# Patient Record
Sex: Male | Born: 1975 | Race: White | Hispanic: No | Marital: Single | Smoking: Never smoker
Health system: Southern US, Community
[De-identification: ages and names within clinical notes are randomized; demographics above are authoritative.]

## PROBLEM LIST (undated history)

## (undated) DIAGNOSIS — T4145XA Adverse effect of unspecified anesthetic, initial encounter: Secondary | ICD-10-CM

## (undated) DIAGNOSIS — T8859XA Other complications of anesthesia, initial encounter: Secondary | ICD-10-CM

## (undated) DIAGNOSIS — S46219A Strain of muscle, fascia and tendon of other parts of biceps, unspecified arm, initial encounter: Secondary | ICD-10-CM

## (undated) DIAGNOSIS — H7291 Unspecified perforation of tympanic membrane, right ear: Secondary | ICD-10-CM

## (undated) HISTORY — PX: INNER EAR SURGERY: SHX679

## (undated) HISTORY — PX: WISDOM TOOTH EXTRACTION: SHX21

---

## 2012-03-28 ENCOUNTER — Encounter (HOSPITAL_BASED_OUTPATIENT_CLINIC_OR_DEPARTMENT_OTHER): Payer: Self-pay | Admitting: *Deleted

## 2012-03-28 ENCOUNTER — Emergency Department (HOSPITAL_BASED_OUTPATIENT_CLINIC_OR_DEPARTMENT_OTHER)
Admission: EM | Admit: 2012-03-28 | Discharge: 2012-03-29 | Disposition: A | Discharge status: Home / Self Care | Payer: Self-pay | Attending: Emergency Medicine | Admitting: Emergency Medicine

## 2012-03-28 DIAGNOSIS — H6690 Otitis media, unspecified, unspecified ear: Secondary | ICD-10-CM

## 2012-03-28 DIAGNOSIS — H7290 Unspecified perforation of tympanic membrane, unspecified ear: Secondary | ICD-10-CM | POA: Insufficient documentation

## 2012-03-28 HISTORY — DX: Unspecified perforation of tympanic membrane, right ear: H72.91

## 2012-03-28 NOTE — ED Notes (Signed)
Pt accidentally got pond water in his right ear at 630 this evening. Pt has hx of perforated eardrum and is supposed to protect his ears prior to getting in water. Pt reports right sided earache.

## 2012-03-29 MED ORDER — NEOMYCIN-COLIST-HC-THONZONIUM 3.3-3-10-0.5 MG/ML OT SUSP
4.0000 [drp] | Freq: Four times a day (QID) | OTIC | Status: DC
  Administered 2012-03-29: 4 [drp] via OTIC
  Filled 2012-03-29: qty 5

## 2012-03-29 MED ORDER — AMOXICILLIN 875 MG PO TABS: 875.0000 mg | ORAL_TABLET | Freq: Three times a day (TID) | ORAL | Status: AC

## 2012-03-29 NOTE — Discharge Instructions (Signed)
Draining Ear Ear wax, pus, blood and other fluids are examples of the different types of drainage from ears. Drops or cream may be needed to lessen the itching which may occur with ear drainage. CAUSES   Skin irritations in the ear.   Ear infection.   Swimmer's ear.   Ruptured eardrum.   Foreign object in the ear canal.   Sudden pressure changes.   Head injury.  HOME CARE INSTRUCTIONS   Only take over-the-counter or prescription medicines for pain, fever, or discomfort as directed by your caregiver.   Do not rub the ear canal with cotton-tipped swabs.   Do not swim.   Before you take a shower, cover a cotton ball with petroleum jelly to keep water out.   Limit exposure to smoke. Secondhand smoke can increase the chance for ear infections.   Keep up with immunizations.   Wash your hands well.   Keep all follow-up appointments to examine the ear and evaluate hearing.  SEEK MEDICAL CARE IF:   You have increased drainage.   You have ear pain, a fever, or drainage that is not getting better after 48 hours of antibiotics.   You are unusually tired.  SEEK IMMEDIATE MEDICAL CARE IF:  You have severe ear pain or headache.   You vomit.   You feel dizzy.   You have a seizure.   You have new hearing loss.  MAKE SURE YOU:   Understand these instructions.   Will watch your condition.   Will get help right away if you are not doing well or get worse.  Document Released: 10/25/2005 Document Revised: 10/14/2011 Document Reviewed: 08/28/2009 [facility] Patient Information 2012 [place], [place].

## 2012-03-29 NOTE — ED Provider Notes (Signed)
History     CSN: [id number]  Arrival date & time 03/28/12  2203   First MD Initiated Contact with Patient 03/29/12 [phone]      Chief Complaint  Patient presents with  . 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 Has never had this surgically repaired. He was working yesterday evening about 6:30 and got some 30 pound water splashed in his ear. Within about 2 and half hours she developed severe pain in the right ear. He took some garlic extract with significant improvement in his pain. 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 He states his been draining clear fluid.  Past Medical History  Diagnosis Date  . Perforated eardrum, right     History reviewed. No pertinent past surgical history.  No family history on file.  History  Substance Use Topics  . Smoking status: Never Smoker   . Smokeless tobacco: Not on file  . Alcohol Use: No      Review of Systems  All other systems reviewed and are negative.    Allergies  Review of patient's allergies indicates no known allergies.  Home Medications   Current Outpatient Rx  Name Route Sig Dispense Refill  . GARLIC PO Oral Take 3 tablets by mouth daily.    . IBUPROFEN 200 MG PO TABS Oral Take 200 mg by mouth every 6 (six) hours as needed. Patient used this medication for pain.    Marland Kitchen PSEUDOEPHEDRINE-IBUPROFEN 30-200 MG PO TABS Oral Take 1 tablet by mouth daily as needed. Patient used this medication for sinus congestion.      BP 152/92  Pulse 71  Temp(Src) 98.4 F (36.9 C) (Oral)  Resp 18  Ht 5\' 10"  (1.778 m)  Wt 180 lb (81.647 kg)  BMI 25.83 kg/m2  SpO2 98%  Physical Exam General: Well-developed, well-nourished male in no acute distress; appearance consistent with age of record HENT: normocephalic, atraumatic; left TM normal; right TM with chronic  appearing perforation and erythema of the TM itself, no exudate seen Eyes: pupils equal round and reactive to light; extraocular muscles intact Neck: supple Heart: regular rate and rhythm Lungs: Normal respiratory effort and excursion Abdomen: soft; nondistended Extremities: No deformity; full range of motion Neurologic: Awake, alert and oriented; motor function intact in all extremities and symmetric; no facial droop; decreased hearing in right ear Skin: Warm and dry     ED Course  Procedures (including critical care time)     MDM  We'll treat with Cortisporin optic suspension which is safe for use in perforations. Will also treat with amoxicillin.        Hanley Seamen, MD 03/29/12 [phone]

## 2018-01-27 ENCOUNTER — Other Ambulatory Visit: Payer: Self-pay

## 2018-01-27 ENCOUNTER — Encounter (HOSPITAL_COMMUNITY): Payer: Self-pay | Admitting: *Deleted

## 2018-01-27 NOTE — Progress Notes (Signed)
Pt denies SOB, chest pain, and being under the care of a cardiologist. Pt denies having a stress test, echo and cardiac cath. Pt denies having an EKG and chest x ray within the last year. Pt made aware to stop taking vitamins, fish oil, Garlic, Pseudoephedrine-Ibuprofen and herbal medications. Do not take any NSAIDs ie: Ibuprofen, Advil, Naproxen (Aleve), Motrin, BC and Goody Powder. Pt verbalized understanding of all pre-op instructions.

## 2018-01-27 NOTE — H&P (Addendum)
  Jerry Soto is an 42 y.o. male.   Chief Complaint: RIGHT DISTAL BICEPS TENDON TEAR  HPI: The patient is a 42 y/o male who felt a pop in his right antecubital fossa on 01/17/18 when he was doing a jerking motion. He was seen by Dr Ranell PatrickNorris who ordered the MRI that showed a distal biceps tendon tear. Dr Melvyn Novasrtmann went to see the patient to discuss the surgical procedure, including the risks versus benefits, and the post-operative recovery.  The patient is here today for surgery.  He denies fever, chills, nausea, vomiting, diarrhea, chest pain, or shortness of breath.  Past Medical History:  Diagnosis Date  . Perforated eardrum, right     No past surgical history on file.  No family history on file. Social History:  reports that he has never smoked. He does not have any smokeless tobacco history on file. He reports that he does not drink alcohol or use drugs.  Allergies: No Known Allergies  No medications prior to admission.    No results found for this or any previous visit (from the past 48 hour(s)). No results found.  ROS NO RECENT ILLNESSES OR HOSPITALIZATIONS  There were no vitals taken for this visit. Physical Exam  General Appearance:  Alert, cooperative, no distress, appears stated age  Head:  Normocephalic, without obvious abnormality, atraumatic  Eyes:  Pupils equal, conjunctiva/corneas clear,         Throat: Lips, mucosa, and tongue normal; teeth and gums normal  Neck: No visible masses     Lungs:   respirations unlabored  Chest Wall:  No tenderness or deformity  Heart:  Regular rate and rhythm,  Abdomen:   Soft, non-tender,         Extremities: RUE: SKIN INTACT, FINGERS WARM WELL PERFUSED GOOD DIGITAL MOTION ABLE TO EXTEND THUMB AND WRIST  Pulses: 2+ and symmetric  Skin: Skin color, texture, turgor normal, no rashes or lesions     Neurologic: Normal    Assessment RIGHT DISTAL BICEPS TENDON TEAR  Plan RIGHT DISTAL BICEPS TENDON REPAIR  R/B/A  DISCUSSED WITH PT IN OFFICE.  PT VOICED UNDERSTANDING OF PLAN CONSENT SIGNED DAY OF SURGERY PT SEEN AND EXAMINED PRIOR TO OPERATIVE PROCEDURE/DAY OF SURGERY SITE MARKED. QUESTIONS ANSWERED WILL GO HOME FOLLOWING SURGERY  WE ARE PLANNING SURGERY FOR YOUR UPPER EXTREMITY. THE RISKS AND BENEFITS OF SURGERY INCLUDE BUT NOT LIMITED TO BLEEDING INFECTION, DAMAGE TO NEARBY NERVES ARTERIES TENDONS, FAILURE OF SURGERY TO ACCOMPLISH ITS INTENDED GOALS, PERSISTENT SYMPTOMS AND NEED FOR FURTHER SURGICAL INTERVENTION. WITH THIS IN MIND WE WILL PROCEED. I HAVE DISCUSSED WITH THE PATIENT THE PRE AND POSTOPERATIVE REGIMEN AND THE DOS AND DON'TS. PT VOICED UNDERSTANDING AND INFORMED CONSENT SIGNED.    Karma GreaserSamantha Bonham Barton 01/27/2018, 12:36 PM   WE ARE PLANNING SURGERY FOR YOUR UPPER EXTREMITY. THE RISKS AND BENEFITS OF SURGERY INCLUDE BUT NOT LIMITED TO BLEEDING INFECTION, DAMAGE TO NEARBY NERVES ARTERIES TENDONS, FAILURE OF SURGERY TO ACCOMPLISH ITS INTENDED GOALS, PERSISTENT SYMPTOMS AND NEED FOR FURTHER SURGICAL INTERVENTION. WITH THIS IN MIND WE WILL PROCEED. I HAVE DISCUSSED WITH THE PATIENT THE PRE AND POSTOPERATIVE REGIMEN AND THE DOS AND DON'TS. PT VOICED UNDERSTANDING AND INFORMED CONSENT SIGNED  R/B/A DISCUSSED WITH PT IN OFFICE.  PT VOICED UNDERSTANDING OF PLAN CONSENT SIGNED DAY OF SURGERY PT SEEN AND EXAMINED PRIOR TO OPERATIVE PROCEDURE/DAY OF SURGERY SITE MARKED. QUESTIONS ANSWERED WILL GO HOME FOLLOWING SURGERY

## 2018-01-30 ENCOUNTER — Ambulatory Visit (HOSPITAL_COMMUNITY): Payer: Self-pay | Admitting: Anesthesiology

## 2018-01-30 ENCOUNTER — Other Ambulatory Visit: Payer: Self-pay

## 2018-01-30 ENCOUNTER — Encounter (HOSPITAL_COMMUNITY)
Admission: RE | Disposition: A | Discharge status: Home / Self Care | Payer: Self-pay | Source: Ambulatory Visit | Attending: Orthopedic Surgery

## 2018-01-30 ENCOUNTER — Ambulatory Visit (HOSPITAL_COMMUNITY)
Admission: RE | Admit: 2018-01-30 | Discharge: 2018-01-30 | Disposition: A | Discharge status: Home / Self Care | Payer: Self-pay | Source: Ambulatory Visit | Attending: Orthopedic Surgery | Admitting: Orthopedic Surgery

## 2018-01-30 ENCOUNTER — Encounter (HOSPITAL_COMMUNITY): Payer: Self-pay | Admitting: *Deleted

## 2018-01-30 DIAGNOSIS — S46211A Strain of muscle, fascia and tendon of other parts of biceps, right arm, initial encounter: Secondary | ICD-10-CM | POA: Insufficient documentation

## 2018-01-30 DIAGNOSIS — X58XXXA Exposure to other specified factors, initial encounter: Secondary | ICD-10-CM | POA: Insufficient documentation

## 2018-01-30 HISTORY — DX: Other complications of anesthesia, initial encounter: T88.59XA

## 2018-01-30 HISTORY — PX: DISTAL BICEPS TENDON REPAIR: SHX1461

## 2018-01-30 HISTORY — DX: Adverse effect of unspecified anesthetic, initial encounter: T41.45XA

## 2018-01-30 HISTORY — DX: Strain of muscle, fascia and tendon of other parts of biceps, unspecified arm, initial encounter: S46.219A

## 2018-01-30 LAB — CBC
HCT: 42.9 % (ref 39.0–52.0)
Hemoglobin: 14.3 g/dL (ref 13.0–17.0)
MCH: 28.4 pg (ref 26.0–34.0)
MCHC: 33.3 g/dL (ref 30.0–36.0)
MCV: 85.3 fL (ref 78.0–100.0)
PLATELETS: 281 10*3/uL (ref 150–400)
RBC: 5.03 MIL/uL (ref 4.22–5.81)
RDW: 13.8 % (ref 11.5–15.5)
WBC: 6 10*3/uL (ref 4.0–10.5)

## 2018-01-30 SURGERY — REPAIR, TENDON, BICEPS, DISTAL
Anesthesia: General | Site: Arm Upper | Laterality: Right

## 2018-01-30 MED ORDER — ONDANSETRON HCL 4 MG/2ML IJ SOLN
INTRAMUSCULAR | Status: AC
  Filled 2018-01-30: qty 2

## 2018-01-30 MED ORDER — KETOROLAC TROMETHAMINE 60 MG/2ML IM SOLN: 60.0000 mg | Freq: Once | INTRAMUSCULAR | Status: DC

## 2018-01-30 MED ORDER — FENTANYL CITRATE (PF) 250 MCG/5ML IJ SOLN
INTRAMUSCULAR | Status: AC
Start: 2018-01-30 — End: 2018-01-30
  Filled 2018-01-30: qty 5

## 2018-01-30 MED ORDER — KETOROLAC TROMETHAMINE 60 MG/2ML IM SOLN
INTRAMUSCULAR | Status: AC
  Filled 2018-01-30: qty 2

## 2018-01-30 MED ORDER — ONDANSETRON HCL 4 MG/2ML IJ SOLN
INTRAMUSCULAR | Status: DC | PRN
  Administered 2018-01-30: 4 mg via INTRAVENOUS

## 2018-01-30 MED ORDER — FENTANYL CITRATE (PF) 100 MCG/2ML IJ SOLN
INTRAMUSCULAR | Status: AC
  Administered 2018-01-30: 50 ug via INTRAVENOUS
  Filled 2018-01-30: qty 2

## 2018-01-30 MED ORDER — HYDROMORPHONE HCL 1 MG/ML IJ SOLN
INTRAMUSCULAR | Status: AC
  Filled 2018-01-30: qty 1

## 2018-01-30 MED ORDER — CHLORHEXIDINE GLUCONATE 4 % EX LIQD: 60.0000 mL | Freq: Once | CUTANEOUS | Status: DC

## 2018-01-30 MED ORDER — PROPOFOL 10 MG/ML IV BOLUS
INTRAVENOUS | Status: AC
  Filled 2018-01-30: qty 20

## 2018-01-30 MED ORDER — OXYCODONE HCL 5 MG PO TABS
ORAL_TABLET | ORAL | Status: AC
  Filled 2018-01-30: qty 1

## 2018-01-30 MED ORDER — FENTANYL CITRATE (PF) 100 MCG/2ML IJ SOLN
INTRAMUSCULAR | Status: AC
  Filled 2018-01-30: qty 2

## 2018-01-30 MED ORDER — MIDAZOLAM HCL 2 MG/2ML IJ SOLN
INTRAMUSCULAR | Status: AC
  Filled 2018-01-30: qty 2

## 2018-01-30 MED ORDER — DEXAMETHASONE SODIUM PHOSPHATE 10 MG/ML IJ SOLN
INTRAMUSCULAR | Status: AC
  Filled 2018-01-30: qty 1

## 2018-01-30 MED ORDER — OXYCODONE HCL 5 MG/5ML PO SOLN: 5.0000 mg | Freq: Once | ORAL | Status: AC | PRN

## 2018-01-30 MED ORDER — KETOROLAC TROMETHAMINE 15 MG/ML IJ SOLN
15.0000 mg | Freq: Once | INTRAMUSCULAR | Status: AC
  Administered 2018-01-30: 15 mg via INTRAVENOUS

## 2018-01-30 MED ORDER — HYDROMORPHONE HCL 1 MG/ML IJ SOLN
1.0000 mg | Freq: Once | INTRAMUSCULAR | Status: AC
  Administered 2018-01-30: 1 mg via INTRAVENOUS

## 2018-01-30 MED ORDER — LIDOCAINE 2% (20 MG/ML) 5 ML SYRINGE
INTRAMUSCULAR | Status: DC | PRN
  Administered 2018-01-30: 60 mg via INTRAVENOUS

## 2018-01-30 MED ORDER — MIDAZOLAM HCL 2 MG/2ML IJ SOLN
1.0000 mg | Freq: Once | INTRAMUSCULAR | Status: AC
  Administered 2018-01-30: 1 mg via INTRAVENOUS

## 2018-01-30 MED ORDER — ONDANSETRON HCL 4 MG/2ML IJ SOLN
4.0000 mg | Freq: Once | INTRAMUSCULAR | Status: AC | PRN
  Administered 2018-01-30: 4 mg via INTRAVENOUS

## 2018-01-30 MED ORDER — FENTANYL CITRATE (PF) 100 MCG/2ML IJ SOLN
INTRAMUSCULAR | Status: DC | PRN
  Administered 2018-01-30: 100 ug via INTRAVENOUS

## 2018-01-30 MED ORDER — KETOROLAC TROMETHAMINE 15 MG/ML IJ SOLN
INTRAMUSCULAR | Status: AC
  Filled 2018-01-30: qty 2

## 2018-01-30 MED ORDER — FENTANYL CITRATE (PF) 100 MCG/2ML IJ SOLN
25.0000 ug | INTRAMUSCULAR | Status: DC | PRN
  Administered 2018-01-30 (×3): 50 ug via INTRAVENOUS

## 2018-01-30 MED ORDER — KETOROLAC TROMETHAMINE 30 MG/ML IJ SOLN: 30.0000 mg | Freq: Once | INTRAMUSCULAR | Status: DC

## 2018-01-30 MED ORDER — LACTATED RINGERS IV SOLN
INTRAVENOUS | Status: DC
  Administered 2018-01-30: 14:00:00 via INTRAVENOUS

## 2018-01-30 MED ORDER — LIDOCAINE HCL (CARDIAC) 20 MG/ML IV SOLN
INTRAVENOUS | Status: AC
Start: 2018-01-30 — End: 2018-01-30
  Filled 2018-01-30: qty 5

## 2018-01-30 MED ORDER — MIDAZOLAM HCL 5 MG/5ML IJ SOLN
INTRAMUSCULAR | Status: DC | PRN
  Administered 2018-01-30: 2 mg via INTRAVENOUS

## 2018-01-30 MED ORDER — CEFAZOLIN SODIUM-DEXTROSE 2-4 GM/100ML-% IV SOLN
2.0000 g | INTRAVENOUS | Status: AC
  Administered 2018-01-30: 2 g via INTRAVENOUS
  Filled 2018-01-30: qty 100

## 2018-01-30 MED ORDER — DEXAMETHASONE SODIUM PHOSPHATE 10 MG/ML IJ SOLN
INTRAMUSCULAR | Status: DC | PRN
  Administered 2018-01-30: 10 mg via INTRAVENOUS

## 2018-01-30 MED ORDER — BUPIVACAINE HCL (PF) 0.25 % IJ SOLN
INTRAMUSCULAR | Status: DC | PRN
  Administered 2018-01-30: 10 mL

## 2018-01-30 MED ORDER — BUPIVACAINE HCL (PF) 0.25 % IJ SOLN
INTRAMUSCULAR | Status: AC
Start: 2018-01-30 — End: 2018-01-30
  Filled 2018-01-30: qty 30

## 2018-01-30 MED ORDER — OXYCODONE HCL 5 MG PO TABS
5.0000 mg | ORAL_TABLET | Freq: Once | ORAL | Status: AC | PRN
  Administered 2018-01-30: 5 mg via ORAL

## 2018-01-30 MED ORDER — PROPOFOL 10 MG/ML IV BOLUS
INTRAVENOUS | Status: DC | PRN
  Administered 2018-01-30: 100 mg via INTRAVENOUS
  Administered 2018-01-30: 190 mg via INTRAVENOUS

## 2018-01-30 SURGICAL SUPPLY — 65 items
BANDAGE ACE 3X5.8 VEL STRL LF (GAUZE/BANDAGES/DRESSINGS) ×2 IMPLANT
BANDAGE ACE 4X5 VEL STRL LF (GAUZE/BANDAGES/DRESSINGS) ×2 IMPLANT
BENZOIN TINCTURE PRP APPL 2/3 (GAUZE/BANDAGES/DRESSINGS) ×2 IMPLANT
BLADE CLIPPER SURG (BLADE) IMPLANT
BNDG ESMARK 4X9 LF (GAUZE/BANDAGES/DRESSINGS) ×2 IMPLANT
BNDG GAUZE ELAST 4 BULKY (GAUZE/BANDAGES/DRESSINGS) IMPLANT
BUR EGG ELITE 4.0 (BURR) ×2 IMPLANT
BUTTON DISTAL BICEPS (Orthopedic Implant) ×2 IMPLANT
CLIP VESOCCLUDE MED 6/CT (CLIP) IMPLANT
CLIP VESOCCLUDE SM WIDE 6/CT (CLIP) IMPLANT
CORDS BIPOLAR (ELECTRODE) ×2 IMPLANT
COVER SURGICAL LIGHT HANDLE (MISCELLANEOUS) ×2 IMPLANT
CUFF TOURNIQUET SINGLE 18IN (TOURNIQUET CUFF) ×2 IMPLANT
CUFF TOURNIQUET SINGLE 24IN (TOURNIQUET CUFF) IMPLANT
DRAIN TLS ROUND 10FR (DRAIN) IMPLANT
DRAPE OEC MINIVIEW 54X84 (DRAPES) IMPLANT
DRAPE SURG 17X23 STRL (DRAPES) ×2 IMPLANT
DRAPE U-SHAPE 47X51 STRL (DRAPES) ×2 IMPLANT
DRSG ADAPTIC 3X8 NADH LF (GAUZE/BANDAGES/DRESSINGS) ×2 IMPLANT
ELECT REM PT RETURN 9FT ADLT (ELECTROSURGICAL) ×2
ELECTRODE REM PT RTRN 9FT ADLT (ELECTROSURGICAL) ×1 IMPLANT
GAUZE SPONGE 4X4 12PLY STRL (GAUZE/BANDAGES/DRESSINGS) ×2 IMPLANT
GLOVE BIOGEL PI IND STRL 8.5 (GLOVE) ×1 IMPLANT
GLOVE BIOGEL PI INDICATOR 8.5 (GLOVE) ×1
GLOVE SURG ORTHO 8.0 STRL STRW (GLOVE) ×2 IMPLANT
GOWN STRL REUS W/ TWL LRG LVL3 (GOWN DISPOSABLE) ×1 IMPLANT
GOWN STRL REUS W/ TWL XL LVL3 (GOWN DISPOSABLE) ×1 IMPLANT
GOWN STRL REUS W/TWL LRG LVL3 (GOWN DISPOSABLE) ×1
GOWN STRL REUS W/TWL XL LVL3 (GOWN DISPOSABLE) ×1
INSERTER BUTTON (SYSTAGENIX WOUND MANAGEMENT) ×2 IMPLANT
KIT BASIN OR (CUSTOM PROCEDURE TRAY) ×2 IMPLANT
KIT BIO-TENODESIS 3X8 DISP (MISCELLANEOUS)
KIT INSRT BABSR STRL DISP BTN (MISCELLANEOUS) IMPLANT
KIT ROOM TURNOVER OR (KITS) ×2 IMPLANT
LOOP VESSEL MAXI BLUE (MISCELLANEOUS) IMPLANT
MANIFOLD NEPTUNE II (INSTRUMENTS) ×2 IMPLANT
NDL SUT 6 .5 CRC .975X.05 MAYO (NEEDLE) IMPLANT
NEEDLE 22X1 1/2 (OR ONLY) (NEEDLE) IMPLANT
NEEDLE MAYO TAPER (NEEDLE)
NS IRRIG 1000ML POUR BTL (IV SOLUTION) ×2 IMPLANT
PACK ORTHO EXTREMITY (CUSTOM PROCEDURE TRAY) ×2 IMPLANT
PAD ARMBOARD 7.5X6 YLW CONV (MISCELLANEOUS) ×4 IMPLANT
PAD CAST 4YDX4 CTTN HI CHSV (CAST SUPPLIES) ×1 IMPLANT
PADDING CAST COTTON 4X4 STRL (CAST SUPPLIES) ×1
PIN DRILL ACL TIGHTROPE 4MM (PIN) ×2 IMPLANT
SOAP 2 % CHG 4 OZ (WOUND CARE) ×2 IMPLANT
SPLINT FIBERGLASS 4X30 (CAST SUPPLIES) ×2 IMPLANT
SPONGE LAP 4X18 X RAY DECT (DISPOSABLE) ×2 IMPLANT
STRIP CLOSURE SKIN 1/2X4 (GAUZE/BANDAGES/DRESSINGS) ×4 IMPLANT
SUT 2 FIBERLOOP 20 STRT BLUE (SUTURE)
SUT FIBERWIRE #2 38 T-5 BLUE (SUTURE)
SUT MNCRL AB 3-0 PS2 18 (SUTURE) IMPLANT
SUT MNCRL AB 4-0 PS2 18 (SUTURE) IMPLANT
SUT PROLENE 4 0 PS 2 18 (SUTURE) IMPLANT
SUT VIC AB 3-0 PS1 18 (SUTURE)
SUT VIC AB 3-0 PS1 18XBRD (SUTURE) IMPLANT
SUTURE 2 FIBERLOOP 20 STRT BLU (SUTURE) IMPLANT
SUTURE FIBERWR #2 38 T-5 BLUE (SUTURE) IMPLANT
SYR CONTROL 10ML LL (SYRINGE) IMPLANT
SYSTEM CHEST DRAIN TLS 7FR (DRAIN) IMPLANT
TOWEL OR 17X24 6PK STRL BLUE (TOWEL DISPOSABLE) ×2 IMPLANT
TOWEL OR 17X26 10 PK STRL BLUE (TOWEL DISPOSABLE) ×2 IMPLANT
TUBE CONNECTING 12X1/4 (SUCTIONS) ×2 IMPLANT
UNDERPAD 30X30 (UNDERPADS AND DIAPERS) ×2 IMPLANT
WATER STERILE IRR 1000ML POUR (IV SOLUTION) ×2 IMPLANT

## 2018-01-30 NOTE — Transfer of Care (Signed)
Immediate Anesthesia Transfer of Care Note  Patient: Jerry Soto  Procedure(s) Performed: DISTAL BICEPS TENDON REPAIR (Right Arm Upper)  Patient Location: PACU  Anesthesia Type:General  Level of Consciousness: drowsy  Airway & Oxygen Therapy: Patient Spontanous Breathing and Patient connected to nasal cannula oxygen  Post-op Assessment: Report given to RN, Post -op Vital signs reviewed and stable and Patient moving all extremities  Post vital signs: Reviewed and stable  Last Vitals:  Vitals Value Taken Time  BP 125/77 01/30/2018  4:53 PM  Temp    Pulse 84 01/30/2018  4:54 PM  Resp 18 01/30/2018  4:54 PM  SpO2 98 % 01/30/2018  4:54 PM  Vitals shown include unvalidated device data.  Last Pain:  Vitals:   01/30/18 1401  TempSrc:   PainSc: 0-No pain      Patients Stated Pain Goal: 2 (01/30/18 1401)  Complications: No apparent anesthesia complications

## 2018-01-30 NOTE — Progress Notes (Signed)
Orthopedic Tech Progress Note Patient Details:  Jerry SpeckingChristopher Soto May 03, 1976 [id number]  Ortho Devices Type of Ortho Device: Arm sling Ortho Device/Splint Location: Right Ortho Device/Splint Interventions: Application, Adjustment   Post Interventions Patient Tolerated: Well Instructions Provided: Care of device   Alvina ChouWilliams, Mercer Stallworth C 01/30/2018, 5:46 PM

## 2018-01-30 NOTE — Discharge Instructions (Addendum)
KEEP BANDAGE CLEAN AND DRY CALL OFFICE FOR F/U APPT [phone] in 14 days DR. Tresea Heine CELL [phone] RX SENT TO PHARMACY KEEP HAND ELEVATED ABOVE HEART OK TO APPLY ICE TO OPERATIVE AREA CONTACT OFFICE IF ANY WORSENING PAIN OR CONCERNS.

## 2018-01-30 NOTE — Op Note (Signed)
PREOPERATIVE DIAGNOSIS: Right elbow distal biceps tendon rupture  POSTOPERATIVE DIAGNOSIS: Same  ATTENDING SURGEON: Dr. Bradly BienenstockFred Tullio Chausse who was scrubbed and present for the entire procedure  ASSISTANT SURGEON: Lambert ModySamantha Barton PA-C who was scrubbed and necessary for the entire procedure who helped aid in exposure placement of the tendon closure and splinting in a timely fashion  ANESTHESIA: Gen. via LMA  OPERATIVE PROCEDURE: #1: Right elbow distal biceps tendon repair #2: Radiographs 3 views right elbow  IMPLANTS: Arthrex Endobutton  RADIOGRAPHIC INTERPRETATION: AP lateral oblique views the elbow do show the Endobutton in place in good position  SURGICAL INDICATIONS: Right-hand-dominant gentleman who sustained the eccentric load to his right elbow. Patient sustained a closed injury to the distal biceps. Patient was seen and evaluated in the office and recommended undergo the above procedure. Risks of surgery include but not limited to bleeding infection damage to nearby nerves arteries or tendons heterotopic bone ossification loss of motion of wrists and digits incomplete relief of symptoms tendon rupture need for further surgical intervention.  SURGICAL TECHNIQUE: Patient is properly divided the preoperative holding area and marked for permanent marker made on the right elbow to indicate the correct operative site. Patient brought back to the operating placed supine on anesthesia and table where general anesthesia was administered. Patient tolerated this well. Able the right upper extremity was then prepped and draped in normal sterile fashion. A timeout was called the correct site was identified and the procedure was then begun. A longitudinal incision made directly in the antecubital region for several centimeters extending distally. Dissection carried down through the skin and subcutaneous tissues. Blunt dissection was then carried out proximally where the biceps tendon was then identified. The  tendon was then delivered into the wound and using the fiber tape suture material the tendon was then prepared bring the 2 limbs of the tendon out distally. These were then fed through the Arthrex Endobutton in the tendon had been trimmed down and appropriately sized for an 8 mm reamer. After preparation the tendon dissection was then carried down distally all the way down to the radial tuberosity. Crossing vasculature was then ligated, crossing veins ligated with small and medium vessel clips. Deep dissection carried all the way down to the tuberosity with a tuberosity was identified. The tendon remnant was then identified on the bone. After identification of the anatomical location of the tendon the guidepin was then placed in the radial tuberosity along the anterior cortex. Position then confirmed using the mini C-arm. This was then drilled through the posterior cortex the near cortex was then overreamed with 8 mm reamer. Thorough wound irrigation done to remove any loose bone fragments. Following this the tendon was then delivered into the the tunnel the Endobutton was secured nicely along the posterior cortex. The fiber loop tape was then reinforced and tied back onto itself reinforcing the repair. The wound was thoroughly irrigated. Final radiographs were then obtained. The subcutaneous tissues were then closed with 3-0 Monocryl. The skin was then closed with 4-0 Prolene. Adaptic dressing a sterile compressive bandage then applied. 10 mL accord percent Marcaine infiltrated locally around the region. Patient is then placed in a long-arm splint and extubated taken recovery room in good condition  POSTOPERATIVE PLAN: Patient be discharged home. Seen back now office in 2 weeks Lateral x-ray of the elbow down to see down to see her therapist for a long-arm splint Follow-up at the 2 week mark 6 week mark and likely 12 week mark.

## 2018-01-30 NOTE — Anesthesia Postprocedure Evaluation (Signed)
Anesthesia Post Note  Patient: Ignatius SpeckingChristopher Lover  Procedure(s) Performed: DISTAL BICEPS TENDON REPAIR (Right Arm Upper)     Patient location during evaluation: PACU Anesthesia Type: General Level of consciousness: awake and alert Pain management: pain level controlled Vital Signs Assessment: post-procedure vital signs reviewed and stable Respiratory status: spontaneous breathing, nonlabored ventilation, respiratory function stable and patient connected to nasal cannula oxygen Cardiovascular status: blood pressure returned to baseline and stable Postop Assessment: no apparent nausea or vomiting Anesthetic complications: no    Last Vitals:  Vitals:   01/30/18 1823 01/30/18 1838  BP: 138/90 (!) 136/92  Pulse: 81 87  Resp: 12 15  Temp:  36.6 C  SpO2: 93% 92%    Last Pain:  Vitals:   01/30/18 1835  TempSrc:   PainSc: 5                  Amyri Frenz COKER

## 2018-01-30 NOTE — Anesthesia Preprocedure Evaluation (Signed)
Anesthesia Evaluation  Patient identified by MRN, date of birth, ID band Patient awake    Reviewed: NPO status , Patient's Chart, lab work & pertinent test results  Airway Mallampati: III  TM Distance: >3 FB Neck ROM: Full    Dental  (+) Teeth Intact, Dental Advisory Given   Pulmonary    breath sounds clear to auscultation       Cardiovascular  Rhythm:Regular Rate:Normal     Neuro/Psych    GI/Hepatic   Endo/Other    Renal/GU      Musculoskeletal   Abdominal (+) + obese,   Peds  Hematology   Anesthesia Other Findings   Reproductive/Obstetrics                             Anesthesia Physical Anesthesia Plan  ASA: II  Anesthesia Plan: General   Post-op Pain Management:    Induction: Intravenous  PONV Risk Score and Plan: Ondansetron and Dexamethasone  Airway Management Planned: LMA  Additional Equipment:   Intra-op Plan:   Post-operative Plan:   Informed Consent: I have reviewed the patients History and Physical, chart, labs and discussed the procedure including the risks, benefits and alternatives for the proposed anesthesia with the patient or authorized representative who has indicated his/her understanding and acceptance.   Dental advisory given  Plan Discussed with: CRNA and Anesthesiologist  Anesthesia Plan Comments:         Anesthesia Quick Evaluation

## 2018-01-30 NOTE — Anesthesia Procedure Notes (Signed)
Procedure Name: LMA Insertion Date/Time: 01/30/2018 3:25 PM Performed by: Shireen QuanButler, Natalie Leclaire R, CRNA Pre-anesthesia Checklist: Patient identified, Emergency Drugs available, Suction available and Patient being monitored Patient Re-evaluated:Patient Re-evaluated prior to induction Oxygen Delivery Method: Circle System Utilized Preoxygenation: Pre-oxygenation with 100% oxygen Induction Type: IV induction Ventilation: Mask ventilation without difficulty LMA: LMA inserted LMA Size: 5.0 Number of attempts: 1 Placement Confirmation: positive ETCO2 Tube secured with: Tape Dental Injury: Teeth and Oropharynx as per pre-operative assessment

## 2018-01-31 ENCOUNTER — Encounter (HOSPITAL_COMMUNITY): Payer: Self-pay | Admitting: Orthopedic Surgery

## 2018-02-14 ENCOUNTER — Ambulatory Visit: Payer: Self-pay | Admitting: *Deleted

## 2018-02-17 ENCOUNTER — Ambulatory Visit: Payer: Self-pay | Attending: Orthopedic Surgery | Admitting: Occupational Therapy

## 2018-02-17 ENCOUNTER — Encounter: Payer: Self-pay | Admitting: Occupational Therapy

## 2018-05-15 ENCOUNTER — Encounter: Payer: Self-pay | Admitting: Emergency Medicine

## 2018-05-15 ENCOUNTER — Emergency Department (INDEPENDENT_AMBULATORY_CARE_PROVIDER_SITE_OTHER)
Admission: EM | Admit: 2018-05-15 | Discharge: 2018-05-15 | Disposition: A | Discharge status: Home / Self Care | Payer: Self-pay | Source: Home / Self Care | Attending: Family Medicine | Admitting: Family Medicine

## 2018-05-15 DIAGNOSIS — R300 Dysuria: Secondary | ICD-10-CM

## 2018-05-15 DIAGNOSIS — N4889 Other specified disorders of penis: Secondary | ICD-10-CM

## 2018-05-15 LAB — POCT URINALYSIS DIP (MANUAL ENTRY)
Bilirubin, UA: NEGATIVE
Glucose, UA: NEGATIVE mg/dL
Leukocytes, UA: NEGATIVE
Nitrite, UA: NEGATIVE
Protein Ur, POC: NEGATIVE mg/dL
Spec Grav, UA: >=1.03 — AB (ref 1.010–1.025)
Urobilinogen, UA: 0.2 E.U./dL
pH, UA: 5.5 (ref 5.0–8.0)

## 2018-05-15 NOTE — ED Provider Notes (Signed)
[facility]    CSN: [id number] Arrival date & time: 05/15/18  1957     History   Chief Complaint Chief Complaint  Patient presents with  . Dysuria    HPI Jerry Soto is a 42 y.o. male.   HPI Jerry Soto is a 42 y.o. male presenting to UC with c/o 3 days of mild dysuria at end of urine stream and swelling at the end of his penis. Denies pain, redness, rash, or sores on penis. Denies penile discharge. Not concerned for STDs.  He did take leftover cefdinir and amoxicillin his wife had from a UTI about 6 weeks ago.  No relief. Wife did recently change the laundry detergent but otherwise no new soaps, lotions, or medications.    Past Medical History:  Diagnosis Date  . Biceps tendon tear    right  . Complication of anesthesia    " it took me a while to wkae up"  . Perforated eardrum, right     There are no active problems to display for this patient.   Past Surgical History:  Procedure Laterality Date  . DISTAL BICEPS TENDON REPAIR Right 01/30/2018   Procedure: DISTAL BICEPS TENDON REPAIR;  Surgeon: Bradly Bienenstock, MD;  Location: [facility] OR;  Service: Orthopedics;  Laterality: Right;  . INNER EAR SURGERY     ruptured eardrum  . WISDOM TOOTH EXTRACTION         Home Medications    Prior to Admission medications   Medication Sig Start Date End Date Taking? Authorizing Provider  GARLIC PO Take 3 tablets by mouth daily.    [provider]  ibuprofen (ADVIL,MOTRIN) 200 MG tablet Take 200 mg by mouth every 6 (six) hours as needed. Patient used this medication for pain.    [provider]    Family History Family History  Problem Relation Age of Onset  . Alzheimer's disease Mother   . Heart disease Father   . Heart disease Other     Social History Social History   Tobacco Use  . Smoking status: Never Smoker  . Smokeless tobacco: Never Used  Substance Use Topics  . Alcohol use: No  . Drug use: No     Allergies     Patient has no known allergies.   Review of Systems Review of Systems  Gastrointestinal: Negative for abdominal pain, diarrhea, nausea and vomiting.  Genitourinary: Positive for dysuria and penile swelling. Negative for discharge, frequency, hematuria, penile pain, scrotal swelling and testicular pain.  Musculoskeletal: Negative for back pain.  Skin: Negative for color change and rash.     Physical Exam Triage Vital Signs ED Triage Vitals  Enc Vitals Group     BP 05/15/18 2009 (!) 123/94     Pulse Rate 05/15/18 2009 82     Resp --      Temp 05/15/18 2009 98.2 F (36.8 C)     Temp Source 05/15/18 2009 Oral     SpO2 05/15/18 2009 98 %     Weight --      Height --      Head Circumference --      Peak Flow --      Pain Score 05/15/18 2010 0     Pain Loc --      Pain Edu? --      Excl. in GC? --    No data found.  Updated Vital Signs BP (!) 123/94 (BP Location: Right Arm)   Pulse 82   Temp  98.2 F (36.8 C) (Oral)   SpO2 98%   Visual Acuity Right Eye Distance:   Left Eye Distance:   Bilateral Distance:    Right Eye Near:   Left Eye Near:    Bilateral Near:     Physical Exam  Constitutional: He is oriented to person, place, and time. He appears well-developed and well-nourished.  HENT:  Head: Normocephalic and atraumatic.  Eyes: EOM are normal.  Neck: Normal range of motion.  Cardiovascular: Normal rate.  Pulmonary/Chest: Effort normal. No respiratory distress.  Genitourinary: Circumcised.  Genitourinary Comments: Chaperoned exam. Mildly swollen appearing penis. No erythema or lesions. No discharge. Non-tender.  Scrotum- no swelling, non-tender.   Musculoskeletal: Normal range of motion.  Neurological: He is alert and oriented to person, place, and time.  Skin: Skin is warm and dry.  Psychiatric: He has a normal mood and affect. His behavior is normal.  Nursing note and vitals reviewed.    UC Treatments / Results  Labs (all labs ordered are listed,  but only abnormal results are displayed) Labs Reviewed  POCT URINALYSIS DIP (MANUAL ENTRY) - Abnormal; Notable for the following components:      Result Value   Ketones, POC UA trace (5) (*)    Spec Grav, UA >=1.030 (*)    Blood, UA trace-intact (*)    All other components within normal limits    EKG None  Radiology No results found.  Procedures Procedures (including critical care time)  Medications Ordered in UC Medications - No data to display  Initial Impression / Assessment and Plan / UC Course  I have reviewed the triage vital signs and the nursing notes.  Pertinent labs & imaging results that were available during my care of the patient were reviewed by me and considered in my medical decision making (see chart for details).     Pt able to pee without straining.  Question if swelling possibly due to allergic reaction to change in laundry detergent? Home care instructions provided below.    Final Clinical Impressions(s) / UC Diagnoses   Final diagnoses:  Dysuria  Penile swelling     Discharge Instructions      You may be having a reaction to recent change in laundry detergent. You may want to try over the counter antihistamine such as claritin or zyrtec (generic forms are okay too).  Please follow up with family medicine or urologist if not improving in 2-3 days, sooner if worsening.    ED Prescriptions    None     Controlled Substance Prescriptions [place] Controlled Substance Registry consulted? Not Applicable   Lurene Shadowhelps, Marquell Saenz O, PA-C 05/16/18 1210

## 2018-05-15 NOTE — ED Triage Notes (Signed)
Pt c/o penile swelling and dysuria x3 days. States he "has taken some cefdiner and amox that his wife had".

## 2018-05-15 NOTE — Discharge Instructions (Signed)
°  You may be having a reaction to recent change in laundry detergent. You may want to try over the counter antihistamine such as claritin or zyrtec (generic forms are okay too).  Please follow up with family medicine or urologist if not improving in 2-3 days, sooner if worsening.

## 2018-05-17 ENCOUNTER — Telehealth: Payer: Self-pay | Admitting: Emergency Medicine

## 2021-09-02 ENCOUNTER — Other Ambulatory Visit: Payer: Self-pay | Admitting: Surgery

## 2021-09-02 ENCOUNTER — Other Ambulatory Visit (HOSPITAL_COMMUNITY): Payer: Self-pay | Admitting: Surgery

## 2021-09-02 DIAGNOSIS — R1011 Right upper quadrant pain: Secondary | ICD-10-CM

## 2021-09-08 ENCOUNTER — Ambulatory Visit (HOSPITAL_COMMUNITY)
Admission: RE | Admit: 2021-09-08 | Discharge: 2021-09-08 | Disposition: A | Discharge status: Home / Self Care | Payer: Self-pay | Source: Ambulatory Visit | Attending: Surgery | Admitting: Surgery

## 2021-09-08 ENCOUNTER — Other Ambulatory Visit: Payer: Self-pay

## 2021-09-08 DIAGNOSIS — R1011 Right upper quadrant pain: Secondary | ICD-10-CM | POA: Insufficient documentation

## 2021-09-08 IMAGING — NM NM HEPATO W/GB/PHARM/[PERSON_NAME]
2 series · 12 of 12 positions shown · non-contrast
Comparison: None.

CLINICAL DATA: Right upper quadrant abdominal pain, nausea,
vomiting, abdominal bloating

EXAM:
NUCLEAR MEDICINE HEPATOBILIARY IMAGING WITH GALLBLADDER EF
TECHNIQUE: Sequential images of the abdomen were obtained [DATE] minutes
following intravenous administration of radiopharmaceutical. After
oral ingestion of Ensure, gallbladder ejection fraction was
determined. At 60 min, normal ejection fraction is greater than 33%.
RADIOPHARMACEUTICALS:  5.4 mCi [T8]  Choletec IV

[he hepatobiliary · 4.52mm/px · 6 of 60 frames shown (1 of 2)]
[frame 6/60]
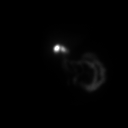
[frame 16/60]
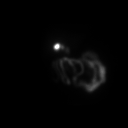
[frame 26/60]
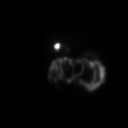
[frame 36/60]
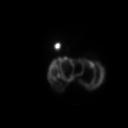
[frame 46/60]
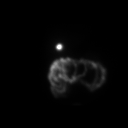
[frame 56/60]
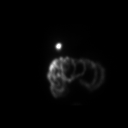

[he hepatobiliary · 4.52mm/px · 6 of 60 frames shown (2 of 2)]
[frame 6/60]
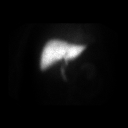
[frame 16/60]
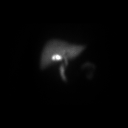
[frame 26/60]
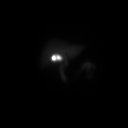
[frame 36/60]
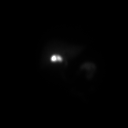
[frame 46/60]
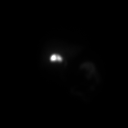
[frame 56/60]
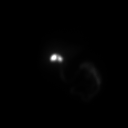

[12 of 12 positions shown; findings below may reference images not displayed]

FINDINGS: Prompt uptake and biliary excretion of activity by the liver is
seen. Gallbladder activity is visualized, consistent with patency of
cystic duct. Biliary activity passes into small bowel, consistent
with patent common bile duct.

Calculated gallbladder ejection fraction is 61%. (Normal gallbladder
ejection fraction with Ensure is greater than 33%.)
IMPRESSION: Normal examination with normal gallbladder contractility.

## 2021-09-08 MED ORDER — TECHNETIUM TC 99M MEBROFENIN IV KIT
5.4000 | PACK | Freq: Once | INTRAVENOUS | Status: AC | PRN
  Administered 2021-09-08: 5.4 via INTRAVENOUS
